# Patient Record
Sex: Female | Born: 2005 | Race: White | Hispanic: No | Marital: Single | Smoking: Never smoker
Health system: Southern US, Community
[De-identification: ages and names within clinical notes are randomized; demographics above are authoritative.]

## PROBLEM LIST (undated history)

## (undated) DIAGNOSIS — J45909 Unspecified asthma, uncomplicated: Secondary | ICD-10-CM

## (undated) DIAGNOSIS — F909 Attention-deficit hyperactivity disorder, unspecified type: Secondary | ICD-10-CM

---
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
## 2023-01-03 ENCOUNTER — Ambulatory Visit: Payer: Managed Care, Other (non HMO) | Admitting: Nurse Practitioner

## 2023-01-19 ENCOUNTER — Ambulatory Visit (INDEPENDENT_AMBULATORY_CARE_PROVIDER_SITE_OTHER): Payer: Managed Care, Other (non HMO) | Admitting: Nurse Practitioner

## 2023-01-19 ENCOUNTER — Encounter: Payer: Self-pay | Admitting: Nurse Practitioner

## 2023-01-19 VITALS — BP 108/80 | HR 107 | Ht 65.0 in | Wt 221.6 lb

## 2023-01-19 DIAGNOSIS — R5383 Other fatigue: Secondary | ICD-10-CM

## 2023-01-19 DIAGNOSIS — J301 Allergic rhinitis due to pollen: Secondary | ICD-10-CM | POA: Diagnosis not present

## 2023-01-19 DIAGNOSIS — J329 Chronic sinusitis, unspecified: Secondary | ICD-10-CM

## 2023-01-19 MED ORDER — CETIRIZINE HCL 10 MG PO TABS: 10.0000 mg | ORAL_TABLET | Freq: Every day | ORAL | 1 refills | Status: DC

## 2023-01-19 MED ORDER — CEFDINIR 300 MG PO CAPS: 300.0000 mg | ORAL_CAPSULE | Freq: Two times a day (BID) | ORAL | 0 refills | Status: DC

## 2023-01-19 NOTE — Patient Instructions (Addendum)
1) Cetirizine 2) Cefdinir 3) Push fluids 4) Follow up appt in 1 week

## 2023-01-19 NOTE — Progress Notes (Signed)
Established Patient Office Visit  Subjective:  Patient ID: Audrey Ibarra, female    DOB: 2005/12/16  Age: 17 y.o. MRN: [id number]  Chief Complaint  Patient presents with   Acute Visit    Sinus headache/cough/cogestion    Acute visit.  Headaches.  Sinus congestion. Cough productive, clear in color.  Fever.      No other concerns at this time.   Past Medical History:  Diagnosis Date   ADHD (attention deficit hyperactivity disorder)    Asthma    Pilonidal abscess 07/05/2021    Past Surgical History:  Procedure Laterality Date   PILONIDAL CYST DRAINAGE  07/05/2021   TONSILLECTOMY AND ADENOIDECTOMY Bilateral 09/01/2022   Procedure: TONSILLECTOMY AND ADENOIDECTOMY;  Surgeon: Bud Face, MD;  Location: [facility];  Service: ENT;  Laterality: Bilateral;  ADENOIDS CAUTERIZED, NO SPECIMEN    Social History   Socioeconomic History   Marital status: Single    Spouse name: Not on file   Number of children: Not on file   Years of education: Not on file   Highest education level: Not on file  Occupational History   Not on file  Tobacco Use   Smoking status: Never   Smokeless tobacco: Never  Substance and Sexual Activity   Alcohol use: No   Drug use: No   Sexual activity: Not on file  Other Topics Concern   Not on file  Social History Narrative   Not on file   Social Determinants of Health   Financial Resource Strain: Not on file  Food Insecurity: Not on file  Transportation Needs: Not on file  Physical Activity: Not on file  Stress: Not on file  Social Connections: Not on file  Intimate Partner Violence: Not on file    No family history on file.  No Known Allergies  Review of Systems  Constitutional:  Positive for malaise/fatigue.  HENT:  Positive for congestion.   Eyes: Negative.   Respiratory: Negative.    Cardiovascular: Negative.   Gastrointestinal: Negative.   Genitourinary: Negative.   Musculoskeletal: Negative.   Skin: Negative.    Neurological:  Positive for headaches.  Endo/Heme/Allergies: Negative.   Psychiatric/Behavioral: Negative.         Objective:   There were no vitals taken for this visit.  There were no vitals filed for this visit.  Physical Exam Vitals reviewed.  Constitutional:      Appearance: Normal appearance.  HENT:     Head: Normocephalic.     Nose: Congestion present.  Eyes:     Pupils: Pupils are equal, round, and reactive to light.  Cardiovascular:     Rate and Rhythm: Normal rate and regular rhythm.  Pulmonary:     Effort: Pulmonary effort is normal.     Breath sounds: Normal breath sounds.  Abdominal:     General: Bowel sounds are normal.     Palpations: Abdomen is soft.  Musculoskeletal:        General: Normal range of motion.     Cervical back: Normal range of motion and neck supple.  Skin:    General: Skin is warm and dry.  Neurological:     Mental Status: She is alert and oriented to person, place, and time.  Psychiatric:        Mood and Affect: Mood normal.        Behavior: Behavior normal.      No results found for any visits on 01/19/23.  Recent Results (from the past 2160  hour(s))  POCT urine pregnancy     Status: Normal   Collection Time: 11/01/22  2:56 PM  Result Value Ref Range   Preg Test, Ur Negative Negative  POCT XPERT XPRESS SARS COVID-2/FLU/RSV [[id number]]     Status: Normal   Collection Time: 11/08/22 12:12 PM  Result Value Ref Range   SARS Coronavirus 2 Negative    FLU A Negative    FLU B Negative    RSV RNA, PCR Negative       Assessment & Plan:   Problem List Items Addressed This Visit   None   No follow-ups on file.   Total time spent: 35 minutes  Orson Eva, NP  01/19/2023

## 2023-01-27 ENCOUNTER — Ambulatory Visit: Payer: Managed Care, Other (non HMO) | Admitting: Nurse Practitioner

## 2023-01-28 ENCOUNTER — Ambulatory Visit: Payer: Managed Care, Other (non HMO)

## 2023-03-01 DIAGNOSIS — M25531 Pain in right wrist: Secondary | ICD-10-CM | POA: Insufficient documentation

## 2023-05-23 ENCOUNTER — Other Ambulatory Visit: Payer: Self-pay

## 2023-05-23 ENCOUNTER — Ambulatory Visit (INDEPENDENT_AMBULATORY_CARE_PROVIDER_SITE_OTHER): Payer: Managed Care, Other (non HMO) | Admitting: Nurse Practitioner

## 2023-05-23 ENCOUNTER — Encounter: Payer: Self-pay | Admitting: Nurse Practitioner

## 2023-05-23 VITALS — BP 118/70 | HR 100 | Temp 98.6°F | Resp 18 | Ht 66.0 in | Wt 217.1 lb

## 2023-05-23 DIAGNOSIS — Z7689 Persons encountering health services in other specified circumstances: Secondary | ICD-10-CM

## 2023-05-23 DIAGNOSIS — Z13220 Encounter for screening for lipoid disorders: Secondary | ICD-10-CM

## 2023-05-23 DIAGNOSIS — Z131 Encounter for screening for diabetes mellitus: Secondary | ICD-10-CM

## 2023-05-23 DIAGNOSIS — B360 Pityriasis versicolor: Secondary | ICD-10-CM | POA: Diagnosis not present

## 2023-05-23 DIAGNOSIS — B379 Candidiasis, unspecified: Secondary | ICD-10-CM | POA: Diagnosis not present

## 2023-05-23 DIAGNOSIS — Z130 Encounter for screening for diseases of the blood and blood-forming organs and certain disorders involving the immune mechanism: Secondary | ICD-10-CM

## 2023-05-23 DIAGNOSIS — F900 Attention-deficit hyperactivity disorder, predominantly inattentive type: Secondary | ICD-10-CM

## 2023-05-23 DIAGNOSIS — J4520 Mild intermittent asthma, uncomplicated: Secondary | ICD-10-CM | POA: Diagnosis not present

## 2023-05-23 DIAGNOSIS — J0110 Acute frontal sinusitis, unspecified: Secondary | ICD-10-CM | POA: Diagnosis not present

## 2023-05-23 DIAGNOSIS — Z114 Encounter for screening for human immunodeficiency virus [HIV]: Secondary | ICD-10-CM

## 2023-05-23 DIAGNOSIS — Z1159 Encounter for screening for other viral diseases: Secondary | ICD-10-CM

## 2023-05-23 MED ORDER — KETOCONAZOLE 2 % EX CREA: 1.0000 | TOPICAL_CREAM | Freq: Two times a day (BID) | CUTANEOUS | 1 refills | Status: DC

## 2023-05-23 MED ORDER — ALBUTEROL SULFATE HFA 108 (90 BASE) MCG/ACT IN AERS: 2.0000 | INHALATION_SPRAY | Freq: Four times a day (QID) | RESPIRATORY_TRACT | 5 refills | Status: DC | PRN

## 2023-05-23 MED ORDER — AMOXICILLIN 500 MG PO CAPS: 500.0000 mg | ORAL_CAPSULE | Freq: Two times a day (BID) | ORAL | 0 refills | Status: AC

## 2023-05-23 MED ORDER — FLUCONAZOLE 150 MG PO TABS
150.0000 mg | ORAL_TABLET | ORAL | 0 refills | Status: DC | PRN
Start: 2023-05-23 — End: 2023-08-31

## 2023-05-23 NOTE — Assessment & Plan Note (Signed)
Albuterol inhaler sent in. stable

## 2023-05-23 NOTE — Assessment & Plan Note (Signed)
-

## 2023-05-23 NOTE — Progress Notes (Signed)
BP 118/70   Pulse 100   Temp 98.6 F (37 C) (Oral)   Resp 18   Ht 5\' 6"  (1.676 m)   Wt 217 lb 1.6 oz (98.5 kg)   SpO2 98%   BMI 35.04 kg/m    Subjective:    Patient ID: Audrey Ibarra, female    DOB: 2006/05/27, 17 y.o.   MRN: [id number]  HPI: Audrey Ibarra is a 17 y.o. female  Chief Complaint  Patient presents with   Establish Care   Sinusitis    Hard to breath, headache, runny nose, stuffy, nausea   Rash    Skin discoloration on arm and chest need referral   Establish care: her last physical was over a year ago.  Medical history includes asthma, adhd.  Family history includes testicular cancer, now also has abdominal cancer, breast cancer, DM.  Health maintenance due for labs.   Asthma:  she reports that her breathing has been fine. She does want an albuterol inhaler for emergencies.  Adhd: not currently treated  Sinusitis: she reports that she started getting congested last Thursday. She says that she has had a sore throat,  nasal congestion, headache. She denies any cough.  She has tried mucinex, vicks vapor rub.  Will start amoxicillin.  She would also like to be tested for covid.   Recommend taking zyrtec, flonase, mucinex, vitamin d, vitamin c, and zinc. Push fluids and get rest.     Tinea versicolor: patient reports she has had this rash across her back and chest for 2-3 years.  She says it will go away and come back.  Will send in ketoconalzole cream.  She has an appointment with dermatology in a year.   Relevant past medical, surgical, family and social history reviewed and updated as indicated. Interim medical history since our last visit reviewed. Allergies and medications reviewed and updated.  Review of Systems  Constitutional: Negative for fever or weight change.  HEENT: positive for nasal congestion, facial pressure, runny nose Respiratory: Negative for cough and shortness of breath.   Cardiovascular: Negative for chest pain or palpitations.   Gastrointestinal: Negative for abdominal pain, no bowel changes.  Musculoskeletal: Negative for gait problem or joint swelling.  Skin: positive for rash.  Neurological: Negative for dizziness positive headache.  No other specific complaints in a complete review of systems (except as listed in HPI above).      Objective:    BP 118/70   Pulse 100   Temp 98.6 F (37 C) (Oral)   Resp 18   Ht 5\' 6"  (1.676 m)   Wt 217 lb 1.6 oz (98.5 kg)   SpO2 98%   BMI 35.04 kg/m   Wt Readings from Last 3 Encounters:  05/23/23 217 lb 1.6 oz (98.5 kg) (99%, Z= 2.17)*  01/19/23 221 lb 9.6 oz (100.5 kg) (99%, Z= 2.22)*  12/24/22 219 lb 3.2 oz (99.4 kg) (99%, Z= 2.20)*   * Growth percentiles are based on CDC (Girls, 2-20 Years) data.    Physical Exam  Constitutional: Patient appears well-developed and well-nourished. Obese  No distress.  HEENT: head atraumatic, normocephalic, pupils equal and reactive to light, ears TMs clear, neck supple, throat within normal limits Cardiovascular: Normal rate, regular rhythm and normal heart sounds.  No murmur heard. No BLE edema. Pulmonary/Chest: Effort normal and breath sounds normal. No respiratory distress. Abdominal: Soft.  There is no tenderness. Psychiatric: Patient has a normal mood and affect. behavior is normal. Judgment and thought  content normal.   Assessment & Plan:   Problem List Items Addressed This Visit       Respiratory   Sinusitis - Primary    Start amoxicillin,  push fluids get rest.  Start zyrtec, flonase and mucinex      Relevant Medications   amoxicillin (AMOXIL) 500 MG capsule   fluconazole (DIFLUCAN) 150 MG tablet   Other Relevant Orders   Novel Coronavirus, NAA ([facility])   Mild intermittent asthma without complication    Albuterol inhaler sent in. stable      Relevant Medications   albuterol (VENTOLIN HFA) 108 (90 Base) MCG/ACT inhaler     Other   Attention deficit hyperactivity disorder (ADHD), predominantly inattentive  type    Stable, not on medication      Other Visit Diagnoses     Encounter to establish care       schedule cpe   Screening for diabetes mellitus       Relevant Orders   COMPLETE METABOLIC PANEL WITH GFR   Hemoglobin A1c   Encounter for hepatitis C screening test for low risk patient       Relevant Orders   Hepatitis C antibody   Screening for deficiency anemia       Relevant Orders   CBC with Differential/Platelet   Screening for HIV without presence of risk factors       Relevant Medications   fluconazole (DIFLUCAN) 150 MG tablet   Other Relevant Orders   HIV Antibody (routine testing w rflx)   Screening for cholesterol level       Relevant Orders   Lipid panel   Tinea versicolor       start ketoconazole cream   Relevant Medications   ketoconazole (NIZORAL) 2 % cream   fluconazole (DIFLUCAN) 150 MG tablet   Antibiotic-induced yeast infection       diflucan sent in due to antibiotic   Relevant Medications   ketoconazole (NIZORAL) 2 % cream   fluconazole (DIFLUCAN) 150 MG tablet        Follow up plan: Return for cpe.

## 2023-05-23 NOTE — Assessment & Plan Note (Signed)
Start amoxicillin,  push fluids get rest.  Start zyrtec, flonase and mucinex

## 2023-08-30 NOTE — Progress Notes (Unsigned)
   There were no vitals taken for this visit.   Subjective:    Patient ID: Audrey Ibarra, female    DOB: Sep 10, 2006, 17 y.o.   MRN: [id number]  HPI: Audrey Ibarra is a 17 y.o. female  No chief complaint on file.   Discussed the use of AI scribe software for clinical note transcription with the patient, who gave verbal consent to proceed.  History of Present Illness           05/23/2023    2:29 PM  Depression screen PHQ 2/9  Decreased Interest 2  Down, Depressed, Hopeless 0  PHQ - 2 Score 2  Altered sleeping 0  Tired, decreased energy 1  Change in appetite 1  Feeling bad or failure about yourself  0  Trouble concentrating 1  Moving slowly or fidgety/restless 0  Suicidal thoughts 0  PHQ-9 Score 5  Difficult doing work/chores Not difficult at all    Relevant past medical, surgical, family and social history reviewed and updated as indicated. Interim medical history since our last visit reviewed. Allergies and medications reviewed and updated.  Review of Systems  Per HPI unless specifically indicated above     Objective:    There were no vitals taken for this visit.  {Vitals History (Optional):23777} Wt Readings from Last 3 Encounters:  05/23/23 217 lb 1.6 oz (98.5 kg) (99%, Z= 2.17)*  01/19/23 221 lb 9.6 oz (100.5 kg) (99%, Z= 2.22)*  12/24/22 219 lb 3.2 oz (99.4 kg) (99%, Z= 2.20)*   * Growth percentiles are based on CDC (Girls, 2-20 Years) data.    Physical Exam  Results for orders placed or performed in visit on 11/08/22  POCT XPERT XPRESS SARS COVID-2/FLU/RSV [[id number]]   Collection Time: 11/08/22 12:12 PM  Result Value Ref Range   SARS Coronavirus 2 Negative    FLU A Negative    FLU B Negative    RSV RNA, PCR Negative    {Labs (Optional):23779}    Assessment & Plan:   Problem List Items Addressed This Visit   None    Assessment and Plan             Follow up plan: No follow-ups on file.

## 2023-08-31 ENCOUNTER — Encounter: Payer: Self-pay | Admitting: Nurse Practitioner

## 2023-08-31 ENCOUNTER — Ambulatory Visit: Payer: Medicaid Other | Admitting: Nurse Practitioner

## 2023-08-31 VITALS — BP 116/82 | HR 94 | Temp 97.9°F | Resp 14 | Ht 65.0 in | Wt 225.2 lb

## 2023-08-31 DIAGNOSIS — J45909 Unspecified asthma, uncomplicated: Secondary | ICD-10-CM | POA: Diagnosis not present

## 2023-08-31 DIAGNOSIS — J4520 Mild intermittent asthma, uncomplicated: Secondary | ICD-10-CM

## 2023-08-31 DIAGNOSIS — J988 Other specified respiratory disorders: Secondary | ICD-10-CM | POA: Diagnosis not present

## 2023-08-31 DIAGNOSIS — R519 Headache, unspecified: Secondary | ICD-10-CM | POA: Diagnosis not present

## 2023-08-31 DIAGNOSIS — Z1152 Encounter for screening for COVID-19: Secondary | ICD-10-CM

## 2023-09-02 LAB — NOVEL CORONAVIRUS, NAA: SARS-CoV-2, NAA: NOT DETECTED

## 2023-09-02 LAB — SPECIMEN STATUS REPORT

## 2023-10-14 ENCOUNTER — Other Ambulatory Visit: Payer: Self-pay

## 2023-10-14 ENCOUNTER — Ambulatory Visit: Payer: Managed Care, Other (non HMO) | Admitting: Nurse Practitioner

## 2023-10-14 ENCOUNTER — Encounter: Payer: Self-pay | Admitting: Nurse Practitioner

## 2023-10-14 VITALS — BP 118/72 | HR 96 | Temp 98.2°F | Resp 16 | Ht 65.0 in | Wt 225.4 lb

## 2023-10-14 DIAGNOSIS — R051 Acute cough: Secondary | ICD-10-CM

## 2023-10-14 DIAGNOSIS — R5383 Other fatigue: Secondary | ICD-10-CM | POA: Diagnosis not present

## 2023-10-14 DIAGNOSIS — R6889 Other general symptoms and signs: Secondary | ICD-10-CM

## 2023-10-14 DIAGNOSIS — R110 Nausea: Secondary | ICD-10-CM

## 2023-10-14 LAB — POCT INFLUENZA A/B
Influenza A, POC: NEGATIVE
Influenza B, POC: NEGATIVE

## 2023-10-14 MED ORDER — BENZONATATE 100 MG PO CAPS
200.0000 mg | ORAL_CAPSULE | Freq: Two times a day (BID) | ORAL | 0 refills | Status: DC | PRN
Start: 2023-10-14 — End: 2023-11-16

## 2023-10-14 MED ORDER — ONDANSETRON 4 MG PO TBDP
4.0000 mg | ORAL_TABLET | Freq: Three times a day (TID) | ORAL | 0 refills | Status: DC | PRN
Start: 2023-10-14 — End: 2023-11-16

## 2023-10-14 MED ORDER — OSELTAMIVIR PHOSPHATE 75 MG PO CAPS
75.0000 mg | ORAL_CAPSULE | Freq: Two times a day (BID) | ORAL | 0 refills | Status: AC
Start: 2023-10-14 — End: 2023-10-19

## 2023-10-14 NOTE — Progress Notes (Signed)
BP 118/72 (Cuff Size: Large)   Pulse 96   Temp 98.2 F (36.8 C) (Oral)   Resp 16   Ht 5\' 5"  (1.651 m)   Wt 225 lb 6.4 oz (102.2 kg)   SpO2 98%   BMI 37.51 kg/m    Subjective:    Patient ID: Audrey Ibarra, female    DOB: 2006/06/28, 18 y.o.   MRN: [id number]  HPI: Audrey Ibarra is a 18 y.o. female  Chief Complaint  Patient presents with   Headache   Nausea   Nasal Congestion    For 4 days   Flu-like symptoms:  Pt presents with c/o Headache, nausea, cough, and fatigue. Pt denies fever,shortness of breath and body aches. Pt states that she was exposed to the flu at work and is requesting testing. She has not taken any medications for her symptoms.      08/31/2023   11:45 AM 05/23/2023    2:29 PM  Depression screen PHQ 2/9  Decreased Interest 0 2  Down, Depressed, Hopeless 0 0  PHQ - 2 Score 0 2  Altered sleeping  0  Tired, decreased energy  1  Change in appetite  1  Feeling bad or failure about yourself   0  Trouble concentrating  1  Moving slowly or fidgety/restless  0  Suicidal thoughts  0  PHQ-9 Score  5  Difficult doing work/chores  Not difficult at all    Relevant past medical, surgical, family and social history reviewed and updated as indicated. Interim medical history since our last visit reviewed. Allergies and medications reviewed and updated.  Review of Systems  Ten systems reviewed and is negative except as mentioned in HPI      Objective:    BP 118/72 (Cuff Size: Large)   Pulse 96   Temp 98.2 F (36.8 C) (Oral)   Resp 16   Ht 5\' 5"  (1.651 m)   Wt 225 lb 6.4 oz (102.2 kg)   SpO2 98%   BMI 37.51 kg/m    Wt Readings from Last 3 Encounters:  10/14/23 225 lb 6.4 oz (102.2 kg)  08/31/23 225 lb 3.2 oz (102.2 kg) (99%, Z= 2.27)*  05/23/23 217 lb 1.6 oz (98.5 kg) (99%, Z= 2.17)*   * Growth percentiles are based on CDC (Girls, 2-20 Years) data.    Physical Exam Vitals reviewed.  Constitutional:      Appearance: Normal appearance.  HENT:      Head: Normocephalic.     Right Ear: Tympanic membrane normal.     Left Ear: Tympanic membrane normal.     Nose: Nose normal.     Right Sinus: No maxillary sinus tenderness or frontal sinus tenderness.     Left Sinus: No maxillary sinus tenderness or frontal sinus tenderness.     Mouth/Throat:     Mouth: Mucous membranes are moist.     Pharynx: No oropharyngeal exudate or posterior oropharyngeal erythema.     Tonsils: No tonsillar exudate.  Eyes:     Extraocular Movements: Extraocular movements intact.     Conjunctiva/sclera: Conjunctivae normal.     Pupils: Pupils are equal, round, and reactive to light.  Cardiovascular:     Rate and Rhythm: Normal rate and regular rhythm.  Pulmonary:     Effort: Pulmonary effort is normal.     Breath sounds: Normal breath sounds.  Lymphadenopathy:     Cervical: No cervical adenopathy.  Skin:    General: Skin is warm and dry.  Neurological:     General: No focal deficit present.     Mental Status: She is alert and oriented to person, place, and time. Mental status is at baseline.  Psychiatric:        Mood and Affect: Mood normal.        Behavior: Behavior normal.        Thought Content: Thought content normal.        Judgment: Judgment normal.          Assessment & Plan:   Problem List Items Addressed This Visit   None Visit Diagnoses       Acute cough    -  Primary   Relevant Orders   POCT Influenza A/B (Completed)     Flu-like symptoms       Pt encouraged to get plenty of rest, drink plenty of fluids, Take tylenol for pain as needed. Follow up if symptoms persist.   Relevant Medications   benzonatate (TESSALON) 100 MG capsule   ondansetron (ZOFRAN-ODT) 4 MG disintegrating tablet   oseltamivir (TAMIFLU) 75 MG capsule      Prescriber tessalon perls, zofran, and tamiflu due to exposure Can also take zyrtec ( allergy pill)  flonase and mucinex.   Follow up plan: Return if symptoms worsen or fail to improve.

## 2023-11-16 ENCOUNTER — Ambulatory Visit: Payer: Self-pay | Admitting: Nurse Practitioner

## 2023-11-16 ENCOUNTER — Ambulatory Visit (INDEPENDENT_AMBULATORY_CARE_PROVIDER_SITE_OTHER): Admitting: Nurse Practitioner

## 2023-11-16 ENCOUNTER — Encounter: Payer: Self-pay | Admitting: Nurse Practitioner

## 2023-11-16 VITALS — BP 122/76 | HR 98 | Temp 98.7°F | Ht 65.0 in | Wt 227.0 lb

## 2023-11-16 DIAGNOSIS — R112 Nausea with vomiting, unspecified: Secondary | ICD-10-CM

## 2023-11-16 LAB — POCT URINE PREGNANCY: Preg Test, Ur: NEGATIVE

## 2023-11-16 MED ORDER — ONDANSETRON 4 MG PO TBDP
4.0000 mg | ORAL_TABLET | Freq: Three times a day (TID) | ORAL | 0 refills | Status: AC | PRN
Start: 2023-11-16 — End: ?

## 2023-11-16 MED ORDER — PROMETHAZINE HCL 25 MG PO TABS
25.0000 mg | ORAL_TABLET | Freq: Three times a day (TID) | ORAL | 0 refills | Status: DC | PRN
Start: 2023-11-16 — End: 2024-01-26

## 2023-11-16 NOTE — Telephone Encounter (Signed)
 FYI

## 2023-11-16 NOTE — Telephone Encounter (Signed)
 Copied from CRM [id number]. Topic: Clinical - Red Word Triage >> Nov 16, 2023  9:43 AM Audrey Ibarra wrote: Red Word that prompted transfer to Nurse Triage: Last night, patient went to work patient feeling like she was going to pass out. Patient very nauseous. Patient had dizziness and felt light headed, shaking and diarrhea as well.  Chief Complaint: nausea, dizziness Symptoms: n/v, diarrhea, shaking, unsteady on feet, lightheadedness Frequency: x 2 days Pertinent Negatives: Patient denies fever or chest pain Disposition: [] ED /[] Urgent Care (no appt availability in office) / [x] Appointment(In office/virtual)/ []  [facility]/ [] Home Care/ [] Refused Recommended Disposition /[] [place]/ []  Follow-up with PCP Additional Notes:    pt stated she could be pregnant- took Test was negative - but maybe to early to tell  Reason for Disposition  [1] MODERATE dizziness (e.g., interferes with normal activities) AND [2] has NOT been evaluated by doctor (or NP/PA) for this  (Exception: Dizziness caused by heat exposure, sudden standing, or poor fluid intake.)  Nausea is a chronic symptom (recurrent or ongoing AND present > 4 weeks)  Answer Assessment - Initial Assessment Questions 1. NAUSEA SEVERITY: "How bad is the nausea?" (e.g., mild, moderate, severe; dehydration, weight loss)   - MILD: loss of appetite without change in eating habits   - MODERATE: decreased oral intake without significant weight loss, dehydration, or malnutrition   - SEVERE: inadequate caloric or fluid intake, significant weight loss, symptoms of dehydration     severe 2. ONSET: "When did the nausea begin?"     yesterday 3. VOMITING: "Any vomiting?" If Yes, ask: "How many times today?"     yes 4. RECURRENT SYMPTOM: "Have you had nausea before?" If Yes, ask: "When was the last time?" "What happened that time?"     Yes but not this long 5. CAUSE: "What do you think is causing the nausea?"     Unknown, 6. PREGNANCY:  "Is there any chance you are pregnant?" (e.g., unprotected intercourse, missed birth control pill, broken condom)     Test was negative - but maybe to early to tell  Answer Assessment - Initial Assessment Questions 1. DESCRIPTION: "Describe your dizziness."     Spinning  2. LIGHTHEADED: "Do you feel lightheaded?" (e.g., somewhat faint, woozy, weak upon standing)     Unsteady standing, felt like was going to pass out  3. VERTIGO: "Do you feel like either you or the room is spinning or tilting?" (i.e. vertigo)     spinning 4. SEVERITY: "How bad is it?"  "Do you feel like you are going to faint?" "Can you stand and walk?"   - MILD: Feels slightly dizzy, but walking normally.   - MODERATE: Feels unsteady when walking, but not falling; interferes with normal activities (e.g., school, work).   - SEVERE: Unable to walk without falling, or requires assistance to walk without falling; feels like passing out now.      moderate 5. ONSET:  "When did the dizziness begin?"     yesterday 6. AGGRAVATING FACTORS: "Does anything make it worse?" (e.g., standing, change in head position)     Changing position makes it worse 7. HEART RATE: "Can you tell me your heart rate?" "How many beats in 15 seconds?"  (Note: not all patients can do this)       Feel like HR is up and shaky 8. CAUSE: "What do you think is causing the dizziness?"     unknown 9. RECURRENT SYMPTOM: "Have you had dizziness before?" If  Yes, ask: "When was the last time?" "What happened that time?"     no 10. OTHER SYMPTOMS: "Do you have any other symptoms?" (e.g., fever, chest pain, vomiting, diarrhea, bleeding)       Diarrhea, n/v 11. PREGNANCY: "Is there any chance you are pregnant?" "When was your last menstrual period?"       unknown  Protocols used: Nausea-A-AH, Dizziness - Lightheadedness-A-AH

## 2023-11-16 NOTE — Progress Notes (Signed)
 BP 122/76   Pulse 98   Temp 98.7 F (37.1 C)   Ht 5\' 5"  (1.651 m)   Wt 227 lb (103 kg)   HC 18" (45.7 cm)   SpO2 99%   BMI 37.77 kg/m    Subjective:    Patient ID: Audrey Ibarra, female    DOB: 04/20/2006, 18 y.o.   MRN: [id number]  HPI: [name] is a 18 y.o. female  Chief Complaint  Patient presents with   Emesis    Nausea and dizziness    Discussed the use of AI scribe software for clinical note transcription with the patient, who gave verbal consent to proceed.  History of Present Illness   [name] is a 18 year old female who presents with nausea and vomiting.  She has been experiencing nausea since Sunday night, which led her to leave work early. Vomiting began on the day of the visit. No fever is present, and no one else around her is sick. She feels faint and needs to sit down at work due to the nausea.  Her last menstrual cycle was expected the day before the visit, but she has not experienced any bleeding, only cramping. A pregnancy test was conducted and returned negative.  She feels generally tired and has been experiencing tenderness in her abdomen.  She has been following a bland diet, including crackers, to manage her symptoms.       12 /18/2024   11:45 AM 05/23/2023    2:29 PM  Depression screen PHQ 2/9  Decreased Interest 0 2  Down, Depressed, Hopeless 0 0  PHQ - 2 Score 0 2  Altered sleeping  0  Tired, decreased energy  1  Change in appetite  1  Feeling bad or failure about yourself   0  Trouble concentrating  1  Moving slowly or fidgety/restless  0  Suicidal thoughts  0  PHQ-9 Score  5  Difficult doing work/chores  Not difficult at all    Relevant past medical, surgical, family and social history reviewed and updated as indicated. Interim medical history since our last visit reviewed. Allergies and medications reviewed and updated.  Review of Systems  Ten systems reviewed and is negative except as mentioned in HPI       Objective:    BP 122/76   Pulse 98   Temp 98.7 F (37.1 C)   Ht 5\' 5"  (1.651 m)   Wt 227 lb (103 kg)   HC 18" (45.7 cm)   SpO2 99%   BMI 37.77 kg/m    Wt Readings from Last 3 Encounters:  11/16/23 227 lb (103 kg)  10/14/23 225 lb 6.4 oz (102.2 kg)  08/31/23 225 lb 3.2 oz (102.2 kg) (99%, Z= 2.27)*   * Growth percentiles are based on CDC (Girls, 2-20 Years) data.    Physical Exam Vitals reviewed.  Constitutional:      Appearance: Normal appearance.  HENT:     Head: Normocephalic.  Cardiovascular:     Rate and Rhythm: Normal rate.  Pulmonary:     Effort: Pulmonary effort is normal.  Abdominal:     General: Bowel sounds are normal. There is no distension.     Palpations: Abdomen is soft.     Tenderness: There is abdominal tenderness. There is no guarding or rebound.  Neurological:     General: No focal deficit present.     Mental Status: She is alert and oriented to person, place, and time. Mental  status is at baseline.  Psychiatric:        Mood and Affect: Mood normal.        Behavior: Behavior normal.        Thought Content: Thought content normal.        Judgment: Judgment normal.     Results for orders placed or performed in visit on 11/16/23  POCT urine pregnancy   Collection Time: 11/16/23  1:18 PM  Result Value Ref Range   Preg Test, Ur Negative Negative       Assessment & Plan:   Problem List Items Addressed This Visit   None Visit Diagnoses       Nausea and vomiting, unspecified vomiting type    -  Primary   Relevant Medications   ondansetron (ZOFRAN-ODT) 4 MG disintegrating tablet   promethazine (PHENERGAN) 25 MG tablet   Other Relevant Orders   POCT urine pregnancy (Completed)        Assessment and Plan    Gastroenteritis Nausea and vomiting since Sunday, with abdominal tenderness on examination. Likely viral gastroenteritis given the current prevalence in the community. -Prescribe Zofran for nausea, with Phenergan as a stronger  alternative if needed. -Advise on maintaining hydration and following a bland diet (bananas, rice, apples, toast). -Check progress on Friday and provide a work note if necessary.  Pregnancy test negative.        Follow up plan: Return if symptoms worsen or fail to improve.

## 2023-11-19 ENCOUNTER — Encounter: Payer: Self-pay | Admitting: Nurse Practitioner

## 2023-12-21 ENCOUNTER — Ambulatory Visit: Payer: Managed Care, Other (non HMO) | Admitting: Dermatology

## 2024-01-03 ENCOUNTER — Ambulatory Visit: Admitting: Dermatology

## 2024-01-26 ENCOUNTER — Encounter: Payer: Self-pay | Admitting: Nurse Practitioner

## 2024-01-26 ENCOUNTER — Ambulatory Visit: Admitting: Nurse Practitioner

## 2024-01-26 VITALS — BP 122/72 | HR 115 | Temp 98.1°F | Ht 65.0 in | Wt 234.3 lb

## 2024-01-26 DIAGNOSIS — J4520 Mild intermittent asthma, uncomplicated: Secondary | ICD-10-CM | POA: Diagnosis not present

## 2024-01-26 DIAGNOSIS — R051 Acute cough: Secondary | ICD-10-CM

## 2024-01-26 LAB — POC COVID19/FLU A&B COMBO
Covid Antigen, POC: NEGATIVE
Influenza A Antigen, POC: NEGATIVE
Influenza B Antigen, POC: NEGATIVE

## 2024-01-26 MED ORDER — ALBUTEROL SULFATE HFA 108 (90 BASE) MCG/ACT IN AERS
2.0000 | INHALATION_SPRAY | Freq: Four times a day (QID) | RESPIRATORY_TRACT | 5 refills | Status: DC | PRN
Start: 2024-01-26 — End: 2024-05-14

## 2024-01-26 MED ORDER — BENZONATATE 100 MG PO CAPS: 200.0000 mg | ORAL_CAPSULE | Freq: Two times a day (BID) | ORAL | 0 refills | Status: DC | PRN

## 2024-01-26 MED ORDER — HYDROCOD POLI-CHLORPHE POLI ER 10-8 MG/5ML PO SUER: 5.0000 mL | Freq: Two times a day (BID) | ORAL | 0 refills | Status: DC | PRN

## 2024-01-26 NOTE — Progress Notes (Signed)
 BP 122/72   Pulse (!) 115   Temp 98.1 F (36.7 C) (Oral)   Ht 5\' 5"  (1.651 m)   Wt 234 lb 4.8 oz (106.3 kg)   SpO2 97%   BMI 38.99 kg/m    Subjective:    Patient ID: Audrey Ibarra, female    DOB: 04-19-2006, 18 y.o.   MRN: [id number]  HPI: Audrey Ibarra is a 18 y.o. female  Chief Complaint  Patient presents with   URI    Began yesterday morning, nasal congestion, cough, fatigue, headache, nausea, abd pain, hoarse    Discussed the use of AI scribe software for clinical note transcription with the patient, who gave verbal consent to proceed.  History of Present Illness Audrey Ibarra is a 18 year old female with asthma who presents with worsening cough and fever.  Symptoms began yesterday with a deep cough and a mild fever of 99.81F. Initially perceived as a minor cold, she wore a mask at work to prevent spreading it. The cough has since worsened, prompting her to seek medical attention. No significant shortness of breath, but a mild sensation of it is noted. No medication has been taken for these symptoms yet.  She has a history of asthma and uses albuterol  as needed, with some remaining at home. She also takes Allegra for allergies and uses an off-brand Flonase . She mentions being on a budget and purchasing medications on sale. She has DayQuil and NyQuil at home and plans to get Mucinex from her mother.  She experiences difficulty sleeping due to her cough, which wakes her up at night. 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       01/26/2024    2:17 PM 08/31/2023   11:45 AM 05/23/2023    2:29 PM  Depression screen PHQ 2/9  Decreased Interest 0 0 2  Down, Depressed, Hopeless 0 0 0  PHQ - 2 Score 0 0 2  Altered sleeping 0  0  Tired, decreased energy 0  1  Change in appetite 0  1  Feeling bad or failure about yourself  0  0  Trouble concentrating 0  1  Moving slowly  or fidgety/restless 0  0  Suicidal thoughts 0  0  PHQ-9 Score 0  5  Difficult doing work/chores Not difficult at all  Not difficult at all    Relevant past medical, surgical, family and social history reviewed and updated as indicated. Interim medical history since our last visit reviewed. Allergies and medications reviewed and updated.  Review of Systems  Ten systems reviewed and is negative except as mentioned in HPI      Objective:      BP 122/72   Pulse (!) 115   Temp 98.1 F (36.7 C) (Oral)   Ht 5\' 5"  (1.651 m)   Wt 234 lb 4.8 oz (106.3 kg)   SpO2 97%   BMI 38.99 kg/m    Wt Readings from Last 3 Encounters:  01/26/24 234 lb 4.8 oz (106.3 kg)  11/16/23 227 lb (103 kg)  10/14/23 225 lb 6.4 oz (102.2 kg)    Physical Exam Vitals reviewed.  Constitutional:      Appearance: Normal appearance.  HENT:     Head: Normocephalic.     Right Ear: Tympanic membrane normal.     Left Ear: Tympanic membrane normal.     Nose: Nose normal.  Right Sinus: No maxillary sinus tenderness or frontal sinus tenderness.     Left Sinus: No maxillary sinus tenderness or frontal sinus tenderness.     Mouth/Throat:     Mouth: Mucous membranes are moist.     Pharynx: Oropharynx is clear. No oropharyngeal exudate or posterior oropharyngeal erythema.  Eyes:     Extraocular Movements: Extraocular movements intact.     Conjunctiva/sclera: Conjunctivae normal.     Pupils: Pupils are equal, round, and reactive to light.  Cardiovascular:     Rate and Rhythm: Normal rate and regular rhythm.  Pulmonary:     Effort: Pulmonary effort is normal.     Breath sounds: Wheezing present.  Lymphadenopathy:     Cervical: No cervical adenopathy.  Skin:    General: Skin is warm and dry.  Neurological:     General: No focal deficit present.     Mental Status: She is alert and oriented to person, place, and time. Mental status is at baseline.  Psychiatric:        Mood and Affect: Mood normal.         Behavior: Behavior normal.        Thought Content: Thought content normal.        Judgment: Judgment normal.    Physical Exam    Results for orders placed or performed in visit on 01/26/24  POC Covid19/Flu A&B Antigen   Collection Time: 01/26/24  3:44 PM  Result Value Ref Range   Influenza A Antigen, POC Negative Negative   Influenza B Antigen, POC Negative Negative   Covid Antigen, POC Negative Negative          Assessment & Plan:   Problem List Items Addressed This Visit       Respiratory   Mild intermittent asthma without complication   Relevant Medications   albuterol  (VENTOLIN  HFA) 108 (90 Base) MCG/ACT inhaler   Other Visit Diagnoses       Acute cough    -  Primary   Relevant Medications   benzonatate  (TESSALON ) 100 MG capsule   chlorpheniramine-HYDROcodone (TUSSIONEX) 10-8 MG/5ML   Other Relevant Orders   POC Covid19/Flu A&B Antigen (Completed)       Assessment and Plan Assessment & Plan Acute cough Cough onset yesterday, deep and worsening, disturbing sleep. Negative for COVID-19 and influenza. Differential includes bronchitis. - Prescribe Desenex at bedtime to suppress cough and aid sleep, cautioning potential drowsiness. - Prescribe Tessalon  Perles to alleviate throat tickle and reduce cough. - Recommend Mucinex to thin mucus and facilitate expectoration. - Request symptom improvement report via message tomorrow after lunch. - Consider steroid taper if symptoms persist or worsen by tomorrow.  Fever Fever of 99.73F with associated body aches since yesterday. - Recommend Tylenol  or ibuprofen for fever and body aches. - Advise adequate hydration.  Asthma Mild wheezing on examination. Uses albuterol  inhaler as needed. No severe dyspnea reported. - Ensure availability of albuterol  inhaler. - Report if wheezing persists or worsens.         Follow up plan: Return if symptoms worsen or fail to improve.

## 2024-01-27 ENCOUNTER — Encounter: Payer: Self-pay | Admitting: Nurse Practitioner

## 2024-02-20 ENCOUNTER — Ambulatory Visit: Admitting: Nurse Practitioner

## 2024-02-20 ENCOUNTER — Other Ambulatory Visit (HOSPITAL_COMMUNITY)
Admission: RE | Admit: 2024-02-20 | Discharge: 2024-02-20 | Disposition: A | Discharge status: Home / Self Care | Source: Ambulatory Visit | Attending: Nurse Practitioner | Admitting: Nurse Practitioner

## 2024-02-20 ENCOUNTER — Encounter: Payer: Self-pay | Admitting: Nurse Practitioner

## 2024-02-20 VITALS — BP 122/84 | HR 97 | Temp 97.8°F | Resp 16 | Ht 65.0 in | Wt 235.3 lb

## 2024-02-20 DIAGNOSIS — Z1159 Encounter for screening for other viral diseases: Secondary | ICD-10-CM

## 2024-02-20 DIAGNOSIS — F330 Major depressive disorder, recurrent, mild: Secondary | ICD-10-CM | POA: Insufficient documentation

## 2024-02-20 DIAGNOSIS — R5383 Other fatigue: Secondary | ICD-10-CM

## 2024-02-20 DIAGNOSIS — R55 Syncope and collapse: Secondary | ICD-10-CM | POA: Diagnosis not present

## 2024-02-20 DIAGNOSIS — F411 Generalized anxiety disorder: Secondary | ICD-10-CM | POA: Diagnosis not present

## 2024-02-20 DIAGNOSIS — A64 Unspecified sexually transmitted disease: Secondary | ICD-10-CM

## 2024-02-20 DIAGNOSIS — F900 Attention-deficit hyperactivity disorder, predominantly inattentive type: Secondary | ICD-10-CM | POA: Diagnosis not present

## 2024-02-20 DIAGNOSIS — Z114 Encounter for screening for human immunodeficiency virus [HIV]: Secondary | ICD-10-CM

## 2024-02-20 MED ORDER — ESCITALOPRAM OXALATE 10 MG PO TABS: 10.0000 mg | ORAL_TABLET | Freq: Every day | ORAL | 0 refills | Status: DC

## 2024-02-20 NOTE — Progress Notes (Signed)
 BP 122/84   Pulse 97   Temp 97.8 F (36.6 C)   Resp 16   Ht 5\' 5"  (1.651 m)   Wt 235 lb 4.8 oz (106.7 kg)   SpO2 98%   BMI 39.16 kg/m    Subjective:    Patient ID: Audrey Ibarra, female    DOB: [birth year], 18 y.o.   MRN: [id number]  HPI: Audrey Ibarra is a 18 y.o. female  Chief Complaint  Patient presents with   ADHD   Depression    Fatigue and Wants to see about getting meds   Depression/ADHD/anxiety:  previously diagnosed with ADHD, previously on focalin.  Reports that it was helpful for her symptoms.  She says as she has been an adult she has noticed that she may need her adhd medication again. She also reports that she has been very sad lately.  Discussed options and decided to start lexapro 10 mg daily.  Follow up in 4 weeks.       Fatigue/syncope: patient reports that she has not had any energy.  She reports this has been going on for some time. She reports that she sleeps all the time. She also reports that she snores.  Discussed this could be sleep apnea will get sleep study.  Will also get labs to rule out other causes.  Could also be associated with her mental health.  Will reevaluate in 4 weeks.  Syncope: patient reports that she has been spacing out when driving. She does not know if it is because she is so tired and she is falling asleep or something else. She says that it has happened a couple of times while she has been driving.  Discussed that it could be falling asleep but will refer to neurology for eeg.       02/20/2024    9:01 AM 01/26/2024    2:17 PM 08/31/2023   11:45 AM  Depression screen PHQ 2/9  Decreased Interest 3 0 0  Down, Depressed, Hopeless 2 0 0  PHQ - 2 Score 5 0 0  Altered sleeping 3 0   Tired, decreased energy 3 0   Change in appetite 2 0   Feeling bad or failure about yourself  1 0   Trouble concentrating 3 0   Moving slowly or fidgety/restless 0 0   Suicidal thoughts 0 0   PHQ-9 Score 17 0   Difficult doing work/chores Extremely  dIfficult Not difficult at all        02/20/2024    9:02 AM 01/26/2024    2:18 PM 05/23/2023    2:30 PM  GAD 7 : Generalized Anxiety Score  Nervous, Anxious, on Edge 1 0 0  Control/stop worrying 2 0 0  Worry too much - different things 3 0 0  Trouble relaxing 0 0 0  Restless 1 0 0  Easily annoyed or irritable 3 0 1  Afraid - awful might happen 0 0 0  Total GAD 7 Score 10 0 1  Anxiety Difficulty Somewhat difficult Not difficult at all Not difficult at all     Relevant past medical, surgical, family and social history reviewed and updated as indicated. Interim medical history since our last visit reviewed. Allergies and medications reviewed and updated.  Review of Systems  Constitutional: Negative for fever or weight change.  Respiratory: Negative for cough and shortness of breath.   Cardiovascular: Negative for chest pain or palpitations.  Gastrointestinal: Negative for abdominal pain, no bowel changes.  Musculoskeletal: Negative for gait problem or joint swelling.  Skin: Negative for rash.  Neurological: Negative for dizziness or headache.  No other specific complaints in a complete review of systems (except as listed in HPI above).      Objective:      BP 122/84   Pulse 97   Temp 97.8 F (36.6 C)   Resp 16   Ht 5\' 5"  (1.651 m)   Wt 235 lb 4.8 oz (106.7 kg)   SpO2 98%   BMI 39.16 kg/m    Wt Readings from Last 3 Encounters:  02/20/24 235 lb 4.8 oz (106.7 kg)  01/26/24 234 lb 4.8 oz (106.3 kg)  11/16/23 227 lb (103 kg)    Physical Exam Vitals reviewed.  Constitutional:      Appearance: Normal appearance.  HENT:     Head: Normocephalic.  Cardiovascular:     Rate and Rhythm: Normal rate and regular rhythm.  Pulmonary:     Effort: Pulmonary effort is normal.     Breath sounds: Normal breath sounds.  Musculoskeletal:        General: Normal range of motion.  Skin:    General: Skin is warm and dry.  Neurological:     General: No focal deficit present.      Mental Status: She is alert and oriented to person, place, and time. Mental status is at baseline.  Psychiatric:        Mood and Affect: Mood normal.        Behavior: Behavior normal.        Thought Content: Thought content normal.        Judgment: Judgment normal.                Assessment & Plan:   Problem List Items Addressed This Visit       Other   Attention deficit hyperactivity disorder (ADHD), predominantly inattentive type - Primary   Start lexapro 10 mg daily, will reeevaluate in 4 weeks, may start Focalin.       Other fatigue   Relevant Orders   CBC with Differential/Platelet   Comprehensive metabolic panel with GFR   Hemoglobin A1c   Thyroid Panel With TSH   Iron, TIBC and Ferritin Panel   VITAMIN D 25 Hydroxy (Vit-D Deficiency, Fractures)   Vitamin B12   Ambulatory referral to Pulmonology   Mild episode of recurrent major depressive disorder (HCC)   Start lexapro 10 mg daily, follow up in 4 weeks.       Relevant Medications   escitalopram (LEXAPRO) 10 MG tablet   GAD (generalized anxiety disorder)   Start lexapro 10 mg daily, follow up in 4 weeks.       Relevant Medications   escitalopram (LEXAPRO) 10 MG tablet   Other Visit Diagnoses       Screening for HIV without presence of risk factors       Relevant Orders   HIV Antibody (routine testing w rflx)     Encounter for hepatitis C screening test for low risk patient       Relevant Orders   Hepatitis C antibody     Syncope, unspecified syncope type       referral placed to neurology, also getting labs and sleep study   Relevant Orders   Ambulatory referral to Neurology     STD (sexually transmitted disease)       Relevant Orders   Cervicovaginal ancillary only  Follow up plan: Return in about 4 weeks (around 03/19/2024) for follow up.

## 2024-02-20 NOTE — Assessment & Plan Note (Signed)
Start lexapro 10 mg daily, follow up in 4 weeks 

## 2024-02-20 NOTE — Assessment & Plan Note (Signed)
 Start lexapro 10 mg daily, will reeevaluate in 4 weeks, may start Focalin.

## 2024-02-21 LAB — CERVICOVAGINAL ANCILLARY ONLY
Chlamydia: NEGATIVE
Comment: NEGATIVE
Comment: NEGATIVE
Comment: NORMAL
Neisseria Gonorrhea: NEGATIVE
Trichomonas: NEGATIVE

## 2024-02-22 ENCOUNTER — Ambulatory Visit: Payer: Self-pay | Admitting: Nurse Practitioner

## 2024-02-24 ENCOUNTER — Ambulatory Visit: Admitting: Sleep Medicine

## 2024-03-01 ENCOUNTER — Ambulatory Visit: Admitting: Sleep Medicine

## 2024-03-15 ENCOUNTER — Ambulatory Visit: Admitting: Sleep Medicine

## 2024-03-19 ENCOUNTER — Ambulatory Visit: Admitting: Nurse Practitioner

## 2024-05-02 ENCOUNTER — Ambulatory Visit: Admitting: Nurse Practitioner

## 2024-05-14 ENCOUNTER — Ambulatory Visit
Admission: EM | Admit: 2024-05-14 | Discharge: 2024-05-14 | Disposition: A | Discharge status: Home / Self Care | Attending: Family Medicine | Admitting: Family Medicine

## 2024-05-14 ENCOUNTER — Ambulatory Visit: Payer: Self-pay | Admitting: Family Medicine

## 2024-05-14 DIAGNOSIS — J4520 Mild intermittent asthma, uncomplicated: Secondary | ICD-10-CM | POA: Diagnosis not present

## 2024-05-14 DIAGNOSIS — J069 Acute upper respiratory infection, unspecified: Secondary | ICD-10-CM

## 2024-05-14 LAB — RESP PANEL BY RT-PCR (FLU A&B, COVID) ARPGX2
Influenza A by PCR: NEGATIVE
Influenza B by PCR: NEGATIVE
SARS Coronavirus 2 by RT PCR: NEGATIVE

## 2024-05-14 MED ORDER — ALBUTEROL SULFATE HFA 108 (90 BASE) MCG/ACT IN AERS: 2.0000 | INHALATION_SPRAY | Freq: Four times a day (QID) | RESPIRATORY_TRACT | 5 refills | Status: AC | PRN

## 2024-05-14 MED ORDER — PROMETHAZINE-DM 6.25-15 MG/5ML PO SYRP: 5.0000 mL | ORAL_SOLUTION | Freq: Four times a day (QID) | ORAL | 0 refills | Status: DC | PRN

## 2024-05-14 NOTE — Discharge Instructions (Addendum)
 Audrey Ibarra, you have a viral respiratory infection that will gradually improve over the next 7-10 days. Cough may last up to 3 weeks.   We will contact you if your COVID or influenza test is positive.  Please quarantine while you wait for the results.  If your test is negative you may resume normal activities.  If your test is positive, quarantine until you are without a fever for at least 24 hours without fever-lowering (Tylenol /Motrin) medications.    If your were prescribed medication, stop by the pharmacy to pick them up.   You can take Tylenol  and/or Ibuprofen as needed for fever reduction and pain relief.    For cough: honey 1/2 to 1 teaspoon (you can dilute the honey in water or another fluid). Stop at the pharmacy to pick up your prescription cough medication.  You can use a humidifier for chest congestion and cough.  If you don't have a humidifier, you can sit in the bathroom with the hot shower running.      For sore throat: try warm salt water gargles, Mucinex sore throat cough drops or cepacol lozenges, throat spray, warm tea or water with lemon/honey, popsicles or ice, or OTC cold relief medicine for throat discomfort. You can also purchase chloraseptic spray at the pharmacy or dollar store.   For congestion: take a daily anti-histamine like Zyrtec , Claritin, and a oral decongestant, such as pseudoephedrine.  You can also use Flonase  1-2 sprays in each nostril daily. Afrin is also a good option, if you do not have high blood pressure.    It is important to stay hydrated: drink plenty of fluids (water, gatorade/powerade/pedialyte, juices, or teas) to keep your throat moisturized and help further relieve irritation/discomfort.    Return or go to the Emergency Department if symptoms worsen or do not improve in the next few days

## 2024-05-14 NOTE — ED Provider Notes (Signed)
 [facility]    CSN: [id number] Arrival date & time: 05/14/24  1650      History   Chief Complaint Chief Complaint  Patient presents with   Cough   Wheezing   Fatigue    HPI Audrey Ibarra is a 18 y.o. female.   HPI  History obtained from the patient. Adelaide presents for cough and wheezing that started yesterday. Has had fatigue, nausea, nasal congestion and headache that started about 9 days ago. No known fever but has felt warm.  No medications today but took NyQuil for her symptoms. Her partner has had a cough and fever. Denies vomiting. Has diarrhea.   She has mild asthma and has not used her inhaler.  Has some shortness of breath but no chest tightness.       Past Medical History:  Diagnosis Date   ADHD (attention deficit hyperactivity disorder)    Asthma    Pilonidal abscess 07/05/2021    Patient Active Problem List   Diagnosis Date Noted   Mild episode of recurrent major depressive disorder (HCC) 02/20/2024   GAD (generalized anxiety disorder) 02/20/2024   Mild intermittent asthma without complication 05/23/2023   Acute pain of right wrist 03/01/2023   Sinusitis 01/19/2023   Other fatigue 01/19/2023   Allergic rhinitis due to pollen 01/19/2023   Acne 12/24/2022   Nonintractable headache 12/24/2022   Attention deficit hyperactivity disorder (ADHD), predominantly inattentive type 11/01/2022   Diarrhea 11/01/2022   Nausea 11/01/2022   Pain in joint of right hip 11/30/2021   Arthritis of right hip 11/30/2021   Strain of muscle of right hip 11/30/2021   Trochanteric bursitis of right hip 11/30/2021   Pilonidal cyst 07/03/2021    Past Surgical History:  Procedure Laterality Date   PILONIDAL CYST DRAINAGE  07/05/2021   TONSILLECTOMY AND ADENOIDECTOMY Bilateral 09/01/2022   Procedure: TONSILLECTOMY AND ADENOIDECTOMY;  Surgeon: Milissa Hamming, MD;  Location: [facility];  Service: ENT;  Laterality: Bilateral;  ADENOIDS CAUTERIZED, NO  SPECIMEN    OB History   No obstetric history on file.      Home Medications    Prior to Admission medications   Medication Sig Start Date End Date Taking? Authorizing Provider  Etonogestrel (NEXPLANON [place])    Yes [provider]  promethazine -dextromethorphan (PROMETHAZINE -DM) 6.25-15 MG/5ML syrup Take 5 mLs by mouth 4 (four) times daily as needed. 05/14/24  Yes Laurelai Lepp, DO  albuterol  (VENTOLIN  HFA) 108 (90 Base) MCG/ACT inhaler Inhale 2 puffs into the lungs every 6 (six) hours as needed for wheezing or shortness of breath. 05/14/24   Carsen Leaf, DO  dicyclomine (BENTYL) 20 MG tablet Take 20 mg by mouth 2 (two) times daily. 01/29/24   [provider]  escitalopram  (LEXAPRO ) 10 MG tablet Take 1 tablet (10 mg total) by mouth daily. 02/20/24   Pender, Julie F, FNP  ondansetron  (ZOFRAN -ODT) 4 MG disintegrating tablet Take 1 tablet (4 mg total) by mouth every 8 (eight) hours as needed for nausea or vomiting. 11/16/23   Gareth Mliss FALCON, FNP    Family History Family History  Problem Relation Age of Onset   Diabetes Father    Cancer Father     Social History Social History   Tobacco Use   Smoking status: Never    Passive exposure: Past   Smokeless tobacco: Never  Vaping Use   Vaping status: Every Day   Substances: Nicotine, Flavoring  Substance Use Topics   Alcohol use: Yes   Drug  use: No     Allergies   Patient has no known allergies.   Review of Systems Review of Systems: negative unless otherwise stated in HPI.      Physical Exam Triage Vital Signs ED Triage Vitals  Encounter Vitals Group     BP      Girls Systolic BP Percentile      Girls Diastolic BP Percentile      Boys Systolic BP Percentile      Boys Diastolic BP Percentile      Pulse      Resp      Temp      Temp src      SpO2      Weight      Height      Head Circumference      Peak Flow      Pain Score      Pain Loc      Pain Education      Exclude from Growth Chart     No data found.  Updated Vital Signs BP 122/80 (BP Location: Right Arm)   Pulse 100   Temp 99.6 F (37.6 C) (Oral)   Resp 18   Wt 113.2 kg   SpO2 100%   BMI 41.52 kg/m   Visual Acuity Right Eye Distance:   Left Eye Distance:   Bilateral Distance:    Right Eye Near:   Left Eye Near:    Bilateral Near:     Physical Exam GEN:     alert, non-toxic appearing female in no distress    HENT:  mucus membranes moist, oropharyngeal without lesions or erythema, no tonsils visible. Clear nasal discharge EYES:   pupils equal and reactive, no scleral injection or discharge NECK:  normal ROM,  no meningismus   RESP:  no increased work of breathing, clear to auscultation bilaterally CVS:   regular rate and rhythm Skin:   warm and dry, no rash on visible skin    UC Treatments / Results  Labs (all labs ordered are listed, but only abnormal results are displayed) Labs Reviewed  RESP PANEL BY RT-PCR (FLU A&B, COVID) [id number]    EKG   Radiology No results found.   Procedures Procedures (including critical care time)  Medications Ordered in UC Medications - No data to display  Initial Impression / Assessment and Plan / UC Course  I have reviewed the triage vital signs and the nursing notes.  Pertinent labs & imaging results that were available during my care of the patient were reviewed by me and considered in my medical decision making (see chart for details).       Pt is a 18 y.o. female who presents for 2 days of flu-like symptoms. Jaysha is afebrile here without recent antipyretics. Satting well on room air. Overall pt is non-toxic appearing, well hydrated, without respiratory distress. Pulmonary exam is unremarkable.  COVID and influenza panel obtained. I will call patient with test results, if positive. History consistent with viral respiratory illness. Discussed symptomatic treatment.  Explained lack of efficacy of antibiotics in viral disease.  Typical duration of  symptoms discussed.  Promethazine  DM for cough. Refilled her albuterol  inhaler.   Return and ED precautions given and voiced understanding. Discussed MDM, treatment plan and plan for follow-up with patient who agrees with plan.   COVID and influenza test is negative.   Final Clinical Impressions(s) / UC Diagnoses   Final diagnoses:  Viral URI with cough  Mild intermittent asthma  without complication     Discharge Instructions      Jakaria Lavergne Ericksen, you have a viral respiratory infection that will gradually improve over the next 7-10 days. Cough may last up to 3 weeks.   We will contact you if your COVID or influenza test is positive.  Please quarantine while you wait for the results.  If your test is negative you may resume normal activities.  If your test is positive, quarantine until you are without a fever for at least 24 hours without fever-lowering (Tylenol /Motrin) medications.    If your were prescribed medication, stop by the pharmacy to pick them up.   You can take Tylenol  and/or Ibuprofen as needed for fever reduction and pain relief.    For cough: honey 1/2 to 1 teaspoon (you can dilute the honey in water or another fluid). Stop at the pharmacy to pick up your prescription cough medication.  You can use a humidifier for chest congestion and cough.  If you don't have a humidifier, you can sit in the bathroom with the hot shower running.      For sore throat: try warm salt water gargles, Mucinex sore throat cough drops or cepacol lozenges, throat spray, warm tea or water with lemon/honey, popsicles or ice, or OTC cold relief medicine for throat discomfort. You can also purchase chloraseptic spray at the pharmacy or dollar store.   For congestion: take a daily anti-histamine like Zyrtec , Claritin, and a oral decongestant, such as pseudoephedrine.  You can also use Flonase  1-2 sprays in each nostril daily. Afrin is also a good option, if you do not have high blood pressure.    It is  important to stay hydrated: drink plenty of fluids (water, gatorade/powerade/pedialyte, juices, or teas) to keep your throat moisturized and help further relieve irritation/discomfort.    Return or go to the Emergency Department if symptoms worsen or do not improve in the next few days       ED Prescriptions     Medication Sig Dispense Auth. Provider   promethazine -dextromethorphan (PROMETHAZINE -DM) 6.25-15 MG/5ML syrup Take 5 mLs by mouth 4 (four) times daily as needed. 118 mL Ryenn Howeth, DO   albuterol  (VENTOLIN  HFA) 108 (90 Base) MCG/ACT inhaler Inhale 2 puffs into the lungs every 6 (six) hours as needed for wheezing or shortness of breath. 8 g Shamir Tuzzolino, DO      PDMP not reviewed this encounter.   Bernadean Saling, DO 05/14/24 1825

## 2024-05-14 NOTE — ED Triage Notes (Signed)
 Sx x 1 weeks  Fatigue Wheezing Cough

## 2024-05-16 ENCOUNTER — Ambulatory Visit (INDEPENDENT_AMBULATORY_CARE_PROVIDER_SITE_OTHER): Admitting: Nurse Practitioner

## 2024-05-16 ENCOUNTER — Encounter: Payer: Self-pay | Admitting: Nurse Practitioner

## 2024-05-16 VITALS — BP 122/76 | HR 98 | Temp 98.3°F | Resp 16 | Ht 65.0 in | Wt 249.3 lb

## 2024-05-16 DIAGNOSIS — F900 Attention-deficit hyperactivity disorder, predominantly inattentive type: Secondary | ICD-10-CM

## 2024-05-16 DIAGNOSIS — J301 Allergic rhinitis due to pollen: Secondary | ICD-10-CM

## 2024-05-16 DIAGNOSIS — F411 Generalized anxiety disorder: Secondary | ICD-10-CM

## 2024-05-16 DIAGNOSIS — F330 Major depressive disorder, recurrent, mild: Secondary | ICD-10-CM | POA: Diagnosis not present

## 2024-05-16 MED ORDER — DEXMETHYLPHENIDATE HCL ER 5 MG PO CP24: 5.0000 mg | ORAL_CAPSULE | Freq: Every day | ORAL | 0 refills | Status: AC

## 2024-05-16 MED ORDER — DEXMETHYLPHENIDATE HCL ER 5 MG PO CP24: 5.0000 mg | ORAL_CAPSULE | Freq: Every day | ORAL | 0 refills | Status: DC

## 2024-05-16 MED ORDER — CETIRIZINE HCL 10 MG PO TABS: 10.0000 mg | ORAL_TABLET | Freq: Every day | ORAL | 11 refills | Status: AC

## 2024-05-16 MED ORDER — ESCITALOPRAM OXALATE 10 MG PO TABS: 10.0000 mg | ORAL_TABLET | Freq: Every day | ORAL | 1 refills | Status: DC

## 2024-05-16 NOTE — Assessment & Plan Note (Signed)
Refill of Lexapro 10 mg sent to pharmacy

## 2024-05-16 NOTE — Assessment & Plan Note (Signed)
 Begin taking the Zyrtec  10mg  once daily. Also advised that she can try Flonase  nasal spray. Advised to avoid triggers.

## 2024-05-16 NOTE — Progress Notes (Signed)
 BP 122/76   Pulse 98   Temp 98.3 F (36.8 C)   Resp 16   Ht 5' 5 (1.651 m)   Wt 249 lb 4.8 oz (113.1 kg)   SpO2 99%   BMI 41.49 kg/m    Subjective:    Patient ID: Audrey Ibarra, female    DOB: Oct 27, 2005, 18 y.o.   MRN: [id number]  HPI: Audrey Ibarra is a 18 y.o. 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 Patient aware that she needs to sign our Controlled Substance Agreement. Form provided to patient and it has been signed.    Allergic Rhinitis: -Reports increase in allergy sx such as runny nose, watery eyes, and a cough -Denies fever. -She reports she has tried Allegra but has not been beneficial in relieving her sx -Advised about trying Zyrtec - Patient interested.Will send in prescription -Also advised that patient could try Flonase  for symptoms    Depression/Anxiety -Patient reports needing refill on her Lexapro  10mg . Patient states she is tolerating it well and not having any side effects. Refill sent in.          05/16/2024   11:33 AM 02/20/2024    9:01 AM 01/26/2024    2:17 PM  Depression screen PHQ 2/9  Decreased Interest 0 3 0  Down, Depressed, Hopeless 0 2 0  PHQ - 2 Score 0 5 0  Altered sleeping 0 3 0  Tired, decreased energy 0 3 0  Change in appetite 0 2 0  Feeling bad or failure about yourself  0 1 0  Trouble concentrating 0 3 0  Moving slowly or fidgety/restless 0 0 0  Suicidal thoughts 0 0 0  PHQ-9 Score 0 17 0  Difficult doing work/chores Not difficult at all Extremely dIfficult Not difficult at all    Relevant past medical, surgical, family and social history reviewed and updated as indicated. Interim medical history since our last visit reviewed. Allergies and medications reviewed and updated.  Review of  Systems  Constitutional: Negative for fever or weight change.  Respiratory: Positive for nasal congestion and intermittent dry cough. Negative for shortness of breath.   Cardiovascular: Negative for chest pain or palpitations.  Gastrointestinal: Negative for abdominal pain, no bowel changes.  Musculoskeletal: Negative for gait problem or joint swelling.  Skin: Negative for rash.  Neurological: Negative for dizziness or headache.  No other specific complaints in a complete review of systems (except as listed in HPI above).      Objective:     BP 122/76   Pulse 98   Temp 98.3 F (36.8 C)   Resp 16   Ht 5' 5 (1.651 m)   Wt 249 lb 4.8 oz (113.1 kg)   SpO2 99%   BMI 41.49 kg/m    Wt Readings from Last 3 Encounters:  05/16/24 249 lb 4.8 oz (113.1 kg)  05/14/24 249 lb 8 oz (113.2 kg)  02/20/24 235 lb 4.8 oz (106.7 kg)    Physical Exam Constitutional:      Appearance: Normal appearance.  HENT:     Head: Normocephalic and atraumatic.  Cardiovascular:     Rate and Rhythm: Normal rate and regular rhythm.     Pulses: Normal pulses.     Heart  sounds: Normal heart sounds.  Pulmonary:     Effort: Pulmonary effort is normal.     Breath sounds: Normal breath sounds.  Musculoskeletal:        General: Normal range of motion.     Cervical back: Normal range of motion and neck supple.  Skin:    General: Skin is warm and dry.  Neurological:     General: No focal deficit present.     Mental Status: She is alert and oriented to person, place, and time.  Psychiatric:        Mood and Affect: Mood normal.        Behavior: Behavior normal.        Thought Content: Thought content normal.        Judgment: Judgment normal.      Results for orders placed or performed during the hospital encounter of 05/14/24  Resp Panel by RT-PCR (Flu A&B, Covid) Anterior Nasal Swab   Collection Time: 05/14/24  5:43 PM   Specimen: Anterior Nasal Swab  Result Value Ref Range   SARS Coronavirus 2 by RT  PCR NEGATIVE NEGATIVE   Influenza A by PCR NEGATIVE NEGATIVE   Influenza B by PCR NEGATIVE NEGATIVE          Assessment & Plan:   Problem List Items Addressed This Visit       Respiratory   Allergic rhinitis due to pollen   Begin taking the Zyrtec  10mg  once daily. Also advised that she can try Flonase  nasal spray. Advised to avoid triggers.       Relevant Medications   cetirizine  (ZYRTEC ) 10 MG tablet     Other   Attention deficit hyperactivity disorder (ADHD), predominantly inattentive type - Primary   Focalin  5mg  sent to pharmacy. Controlled Substance Agreement signed. Patient informed that she has to be seen every 3 months for ADHD follow-up.       Relevant Medications   dexmethylphenidate  (FOCALIN  XR) 5 MG 24 hr capsule (Start on 07/15/2024)   dexmethylphenidate  (FOCALIN  XR) 5 MG 24 hr capsule   dexmethylphenidate  (FOCALIN  XR) 5 MG 24 hr capsule (Start on 06/15/2024)   Mild episode of recurrent major depressive disorder (HCC)   Lexapro  10mg  sent to pharmacy       Relevant Medications   escitalopram  (LEXAPRO ) 10 MG tablet   GAD (generalized anxiety disorder)   Refill of Lexapro  10mg  sent to pharmacy      Relevant Medications   escitalopram  (LEXAPRO ) 10 MG tablet     -Advised to follow-up after starting Focalin  5mg  to be sure tolerating it well -Start taking Zyrtec  10mg  once daily -Start taking Flonase  nasal spray, can be gotten over the counter  -Continue taking Lexapro  mg         Follow up plan: Return in about 3 months (around 08/15/2024).    I have reviewed this encounter including the documentation in this note and/or discussed this patient with the provider, Aislinn Womack, SNP, I am certifying that I agree with the content of this note as supervising/preceptor nurse practitioner.  Mliss Spray, FNP-C [facility] 05/16/2024, 12:06 PM

## 2024-05-16 NOTE — Assessment & Plan Note (Signed)
 Focalin  5mg  sent to pharmacy. Controlled Substance Agreement signed. Patient informed that she has to be seen every 3 months for ADHD follow-up.

## 2024-05-16 NOTE — Assessment & Plan Note (Signed)
Lexapro 10mg sent to pharmacy.

## 2024-08-15 ENCOUNTER — Ambulatory Visit: Admitting: Nurse Practitioner

## 2024-09-24 NOTE — Progress Notes (Unsigned)
" ° ° ° °  GYNECOLOGY OFFICE PROCEDURE NOTE  Audrey Ibarra is a 19 y.o. No obstetric history on file. here for {IUD:23561} IUD removal and reinsertion. No GYN concerns.  Last pap smear, n/a due to age.  IUD Removal and Reinsertion  Patient identified, informed consent performed, consent signed.   Discussed risks of irregular bleeding, cramping, infection, malpositioning or misplacement of the IUD outside the uterus which may require further procedures. Also discussed >99% contraception efficacy, increased risk of ectopic pregnancy with failure of method.   Emphasized that this did not protect against STIs, condoms recommended during all sexual encounters.Advised to use backup contraception for one week as the risk of pregnancy is higher during the transition period of removing an IUD and replacing it with another one. Time out was performed. Speculum placed in the vagina. The strings of the IUD were grasped and pulled using ring forceps. The IUD was successfully removed in its entirety. The cervix was cleaned with Betadine x 2 and grasped anteriorly with an ***Allis clamp. The uterus was sounded to *** cm;  the {IUD:23561} IUD was then placed per manufacturer's recommendations. Strings trimmed to 3 cm. *** Allis clamp was removed, good hemostasis noted. Patient tolerated procedure well.   Patient was given post-procedure instructions.  She was reminded to have backup contraception for one week during this transition period between IUDs.  Patient was also asked to check IUD strings periodically and follow up as needed with any IUD related concerns.   Mathis LITTIE Getting, CMA   "

## 2024-09-25 ENCOUNTER — Encounter: Payer: Self-pay | Admitting: Certified Nurse Midwife

## 2024-09-25 ENCOUNTER — Ambulatory Visit (INDEPENDENT_AMBULATORY_CARE_PROVIDER_SITE_OTHER): Admitting: Certified Nurse Midwife

## 2024-09-25 VITALS — BP 105/75 | HR 153 | Ht 66.0 in | Wt 261.0 lb

## 2024-09-25 DIAGNOSIS — Z3202 Encounter for pregnancy test, result negative: Secondary | ICD-10-CM | POA: Diagnosis not present

## 2024-09-25 DIAGNOSIS — Z3046 Encounter for surveillance of implantable subdermal contraceptive: Secondary | ICD-10-CM

## 2024-09-25 LAB — POCT URINE PREGNANCY: Preg Test, Ur: NEGATIVE

## 2024-09-25 NOTE — Patient Instructions (Signed)
 NEXPLANON PLACEMENT POST-PROCEDURE INSTRUCTIONS  You may take Ibuprofen, Aleve or Tylenol for pain if needed. Pain should resolve within in 24 hours.  You should continue to use condoms for STI prevention and for at least one week after Nexplanon placement to prevent pregnancy.  You need to call if you have any fever, heavy bleeding, cannot find the implant or redness/foul smelling discharge at insertion site. Irregular bleeding is common the first several months after having a Nexplanon placed. You do not need to call for this reason unless you are concerned.  Shower or bathe as normal. You can remove the pressure bandage after 24 hours and the bandaid after 3-5 days.

## 2024-09-26 ENCOUNTER — Other Ambulatory Visit: Payer: Self-pay | Admitting: Nurse Practitioner

## 2024-09-26 DIAGNOSIS — F330 Major depressive disorder, recurrent, mild: Secondary | ICD-10-CM

## 2024-09-26 DIAGNOSIS — F411 Generalized anxiety disorder: Secondary | ICD-10-CM

## 2024-09-27 NOTE — Telephone Encounter (Signed)
 Requested medication (s) are due for refill today: Yes  Requested medication (s) are on the active medication list: Yes  Last refill:  05/16/24  Future visit scheduled: No  Notes to clinic:  Needs OV.    Requested Prescriptions  Pending Prescriptions Disp Refills   escitalopram  (LEXAPRO ) 10 MG tablet [Pharmacy Med Name: ESCITALOPRAM  10 MG TABLET] 90 tablet 1    Sig: TAKE 1 TABLET BY MOUTH EVERY DAY     Psychiatry:  Antidepressants - SSRI Failed - 09/27/2024  3:49 PM      Failed - Valid encounter within last 6 months    Recent Outpatient Visits           4 months ago Attention deficit hyperactivity disorder (ADHD), predominantly inattentive type   [facility] Gareth Clarity F, FNP   7 months ago Attention deficit hyperactivity disorder (ADHD), predominantly inattentive type   [facility] Gareth Clarity FALCON, FNP   8 months ago Acute cough   [facility] Gareth Clarity F, FNP   10 months ago Nausea and vomiting, unspecified vomiting type   [facility] Gareth Clarity FALCON, FNP              Passed - Completed PHQ-2 or PHQ-9 in the last 360 days

## 2024-10-11 MED ORDER — ETONOGESTREL 68 MG ~~LOC~~ IMPL
68.0000 mg | DRUG_IMPLANT | Freq: Once | SUBCUTANEOUS | Status: AC
  Administered 2024-09-25: 68 mg via SUBCUTANEOUS

## 2024-10-11 NOTE — Addendum Note (Signed)
 Addended by: JAYNE RAISIN on: 10/11/2024 11:43 AM   Modules accepted: Orders

## 2024-11-01 ENCOUNTER — Ambulatory Visit: Admitting: Certified Nurse Midwife
